# Patient Record
Sex: Female | Born: 1995 | Race: White | Hispanic: No | Marital: Single | Smoking: Never smoker
Health system: Southern US, Community
[De-identification: ages and names within clinical notes are randomized; demographics above are authoritative.]

## PROBLEM LIST (undated history)

## (undated) DIAGNOSIS — E282 Polycystic ovarian syndrome: Secondary | ICD-10-CM

## (undated) HISTORY — PX: OTHER SURGICAL HISTORY: SHX169

## (undated) HISTORY — PX: BREAST SURGERY: SHX581

---
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
## 2013-01-18 DIAGNOSIS — R21 Rash and other nonspecific skin eruption: Secondary | ICD-10-CM | POA: Insufficient documentation

## 2013-01-18 NOTE — ED Notes (Signed)
Pt. reports generalized non itchy rash onset last night , respirations unlabored / airway intact .

## 2013-01-19 ENCOUNTER — Emergency Department (HOSPITAL_COMMUNITY)
Admission: EM | Admit: 2013-01-19 | Discharge: 2013-01-19 | Disposition: A | Discharge status: Home / Self Care | Payer: BC Managed Care – PPO | Attending: Emergency Medicine | Admitting: Emergency Medicine

## 2013-01-19 ENCOUNTER — Encounter (HOSPITAL_COMMUNITY): Payer: Self-pay | Admitting: Emergency Medicine

## 2013-01-19 ENCOUNTER — Emergency Department (HOSPITAL_COMMUNITY)
Admission: EM | Admit: 2013-01-19 | Discharge: 2013-01-19 | Discharge status: Against Medical Advice | Payer: BC Managed Care – PPO | Attending: Emergency Medicine | Admitting: Emergency Medicine

## 2013-01-19 ENCOUNTER — Emergency Department (HOSPITAL_COMMUNITY): Payer: BC Managed Care – PPO

## 2013-01-19 DIAGNOSIS — R21 Rash and other nonspecific skin eruption: Secondary | ICD-10-CM | POA: Insufficient documentation

## 2013-01-19 DIAGNOSIS — J45909 Unspecified asthma, uncomplicated: Secondary | ICD-10-CM | POA: Insufficient documentation

## 2013-01-19 DIAGNOSIS — Z79899 Other long term (current) drug therapy: Secondary | ICD-10-CM | POA: Insufficient documentation

## 2013-01-19 DIAGNOSIS — Z8639 Personal history of other endocrine, nutritional and metabolic disease: Secondary | ICD-10-CM | POA: Insufficient documentation

## 2013-01-19 DIAGNOSIS — Z862 Personal history of diseases of the blood and blood-forming organs and certain disorders involving the immune mechanism: Secondary | ICD-10-CM | POA: Insufficient documentation

## 2013-01-19 DIAGNOSIS — L299 Pruritus, unspecified: Secondary | ICD-10-CM | POA: Insufficient documentation

## 2013-01-19 DIAGNOSIS — T7840XA Allergy, unspecified, initial encounter: Secondary | ICD-10-CM

## 2013-01-19 HISTORY — DX: Polycystic ovarian syndrome: E28.2

## 2013-01-19 MED ORDER — FAMOTIDINE 20 MG PO TABS: 20.0000 mg | ORAL_TABLET | Freq: Two times a day (BID) | ORAL | Status: AC

## 2013-01-19 MED ORDER — DIPHENHYDRAMINE HCL 25 MG PO TABS: 25.0000 mg | ORAL_TABLET | Freq: Four times a day (QID) | ORAL | Status: AC

## 2013-01-19 MED ORDER — PREDNISONE 10 MG PO TABS: 60.0000 mg | ORAL_TABLET | Freq: Every day | ORAL | Status: AC

## 2013-01-19 NOTE — ED Provider Notes (Signed)
CSN: [id number]     Arrival date & time 01/19/13  0036 History   First MD Initiated Contact with Patient 01/19/13 0231     chief complaint: rash,itch  HPI Patient reports developing itching rash over the past 12-24 hours.  She has no significant history of allergies or allergic reactions before in the past.  History of asthma or eczema.  She has not tried any Benadryl for her symptoms.  She reports this is bilateral arm itching as well as rash involving the majority of all of her body.  The only thing she can think of is that she had macadamia nuts on Sunday night and the only other time she had symptoms like this was a month ago at which time she was also having macadamia nuts.  At that time a month ago she seen a dermatologist who gave her doxycycline for what sounds like possible [facility] spotted fever and a "steroid cream".  The patient reports improvement in her symptoms to 3 days later.  Her symptoms at this time are mild.  She does feel a fullness in her chest but is not significantly short of breath.  She has no difficulty breathing and no throat swelling   Past Medical History  Diagnosis Date  . PCOS (polycystic ovarian syndrome)    Past Surgical History  Procedure Laterality Date  . Breast surgery    . Labial surgery     No family history on file. History  Substance Use Topics  . Smoking status: Never Smoker   . Smokeless tobacco: Not on file  . Alcohol Use: No   OB History   Grav Para Term Preterm Abortions TAB SAB Ect Mult Living                 Review of Systems  All other systems reviewed and are negative.    Allergies  Review of patient's allergies indicates no known allergies.  Home Medications   Current Outpatient Rx  Name  Route  Sig  Dispense  Refill  . ibuprofen (ADVIL,MOTRIN) 200 MG tablet   Oral   Take 200 mg by mouth every 6 (six) hours as needed for pain (pain).         . Multiple Vitamins-Minerals (MULTIVITAMIN WITH MINERALS) tablet  Oral   Take 1 tablet by mouth daily.         . Norethindrone Acetate-Ethinyl Estradiol (JUNEL 1.5/30) 1.5-30 MG-MCG tablet   Oral   Take 1 tablet by mouth daily.         . diphenhydrAMINE (BENADRYL) 25 MG tablet   Oral   Take 1 tablet (25 mg total) by mouth every 6 (six) hours.   20 tablet   0   . famotidine (PEPCID) 20 MG tablet   Oral   Take 1 tablet (20 mg total) by mouth 2 (two) times daily.   10 tablet   0   . predniSONE (DELTASONE) 10 MG tablet   Oral   Take 6 tablets (60 mg total) by mouth daily.   30 tablet   0    LMP 01/14/2013 Physical Exam  Nursing note and vitals reviewed. Constitutional: She is oriented to person, place, and time. She appears well-developed and well-nourished. No distress.  HENT:  Head: Normocephalic and atraumatic.  Eyes: EOM are normal.  Neck: Normal range of motion.  Cardiovascular: Normal rate, regular rhythm and normal heart sounds.   Pulmonary/Chest: Effort normal and breath sounds normal.  Abdominal: Soft. She exhibits no distension.  There is no tenderness.  Musculoskeletal: Normal range of motion.  Neurological: She is alert and oriented to person, place, and time.  Skin: Skin is warm and dry.  Psychiatric: She has a normal mood and affect. Judgment normal.    ED Course  Procedures (including critical care time) Labs Review Labs Reviewed - No data to display Imaging Review No results found.  MDM   1. Allergic reaction, initial encounter    3:23 AM Patient feels much better this time.  Chest x-ray is normal.  She's had improvement in her rash and Benadryl and steroids.  Home with Benadryl, steroids, Pepcid.  PCP followup.  She understands to return to the ER for new or worsening symptoms    Lyanne Co, MD 01/19/13 [phone]

## 2013-01-19 NOTE — ED Notes (Signed)
Pt st's she is going to [employer]

## 2013-01-19 NOTE — ED Notes (Signed)
See downtime RN paperwork for RN notes and triage.

## 2013-12-05 ENCOUNTER — Other Ambulatory Visit: Payer: Self-pay | Admitting: Internal Medicine

## 2013-12-05 DIAGNOSIS — R109 Unspecified abdominal pain: Secondary | ICD-10-CM

## 2013-12-09 ENCOUNTER — Ambulatory Visit
Admission: RE | Admit: 2013-12-09 | Discharge: 2013-12-09 | Disposition: A | Discharge status: Home / Self Care | Payer: BC Managed Care – PPO | Source: Ambulatory Visit | Attending: Internal Medicine | Admitting: Internal Medicine

## 2013-12-09 DIAGNOSIS — R109 Unspecified abdominal pain: Secondary | ICD-10-CM

## 2015-03-19 IMAGING — CR DG CHEST 2V
2 series · 2 of 2 positions shown · non-contrast
Comparison: None

CLINICAL DATA: Shortness of breath; chest pain.

CHEST - 2 VIEW

[w chest pa]
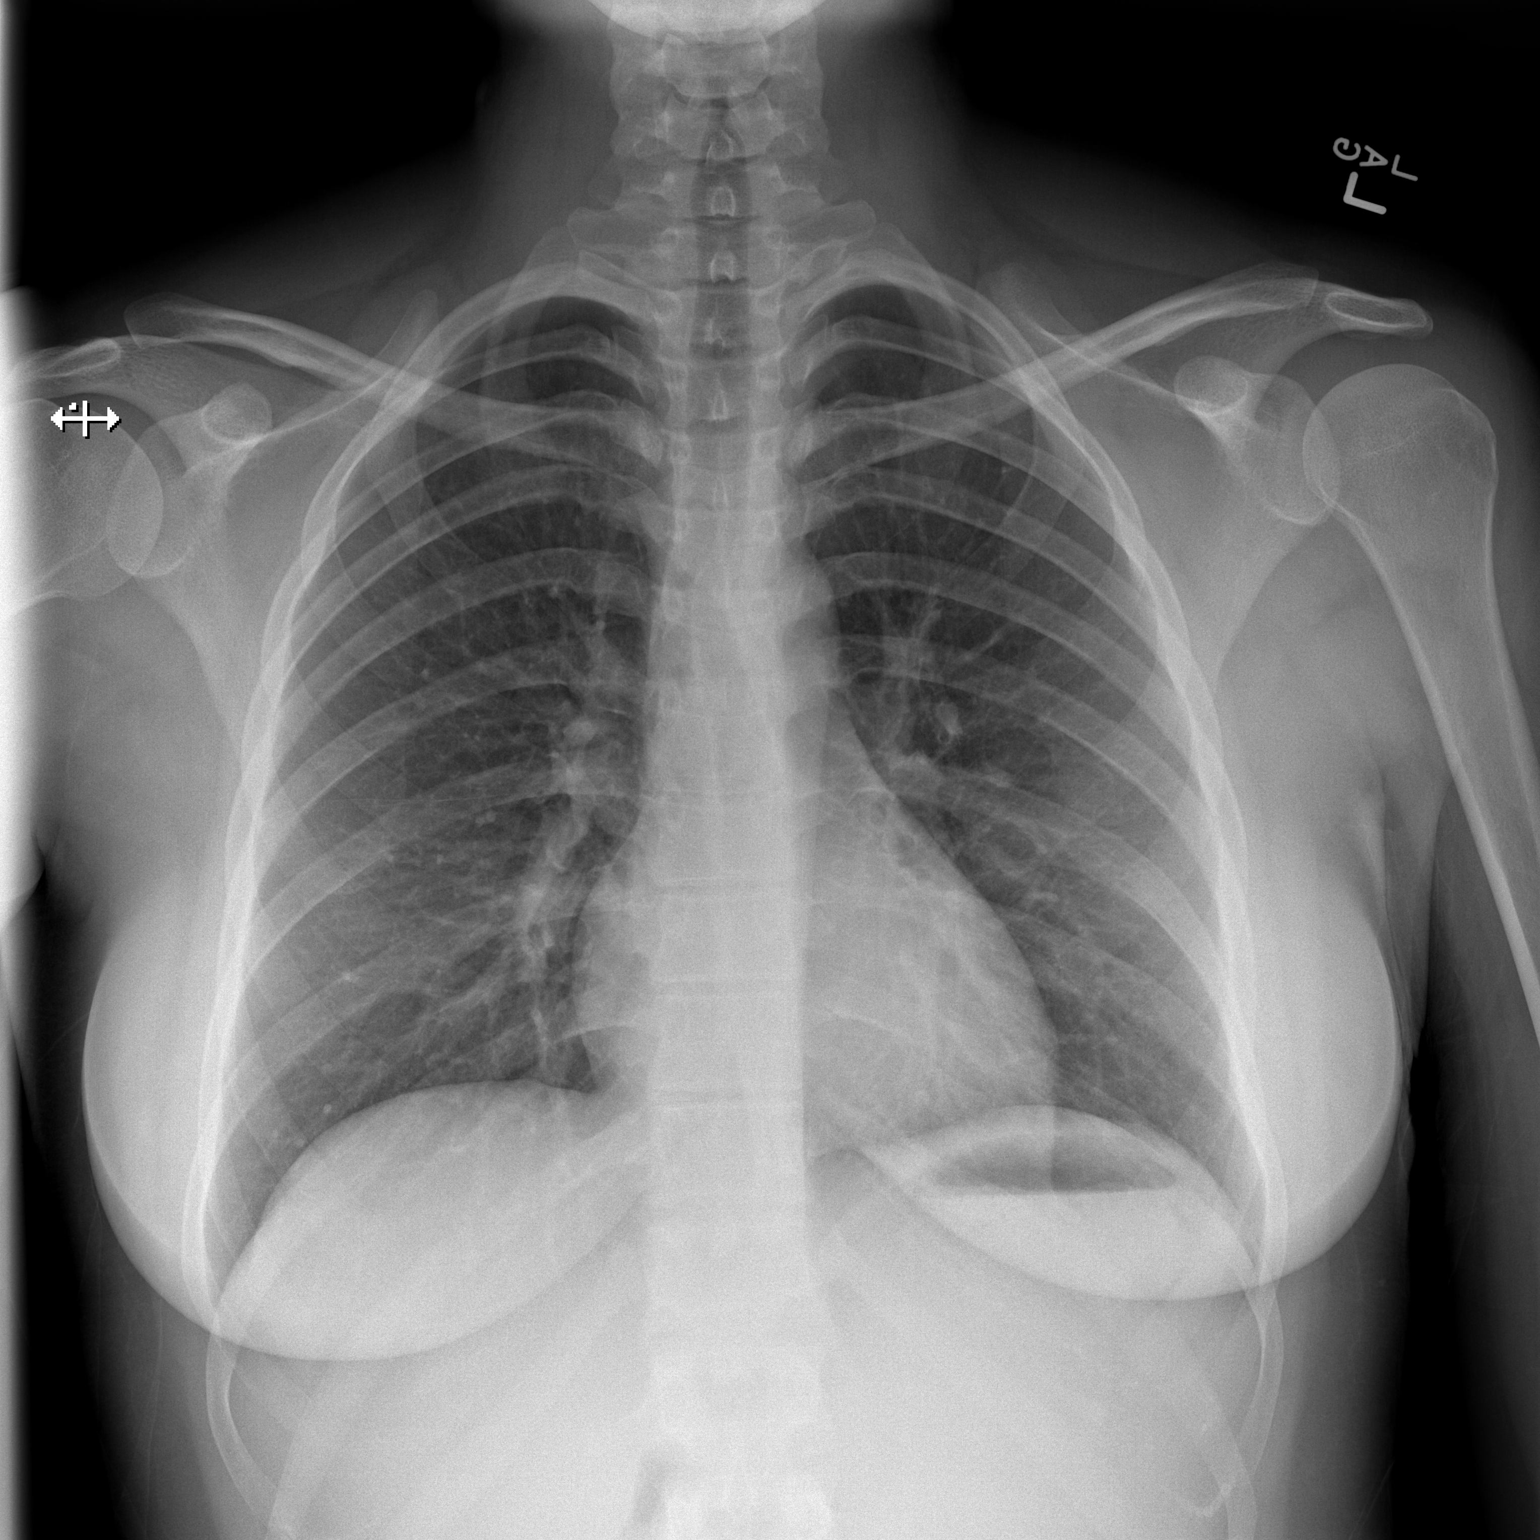

[w chest lat]
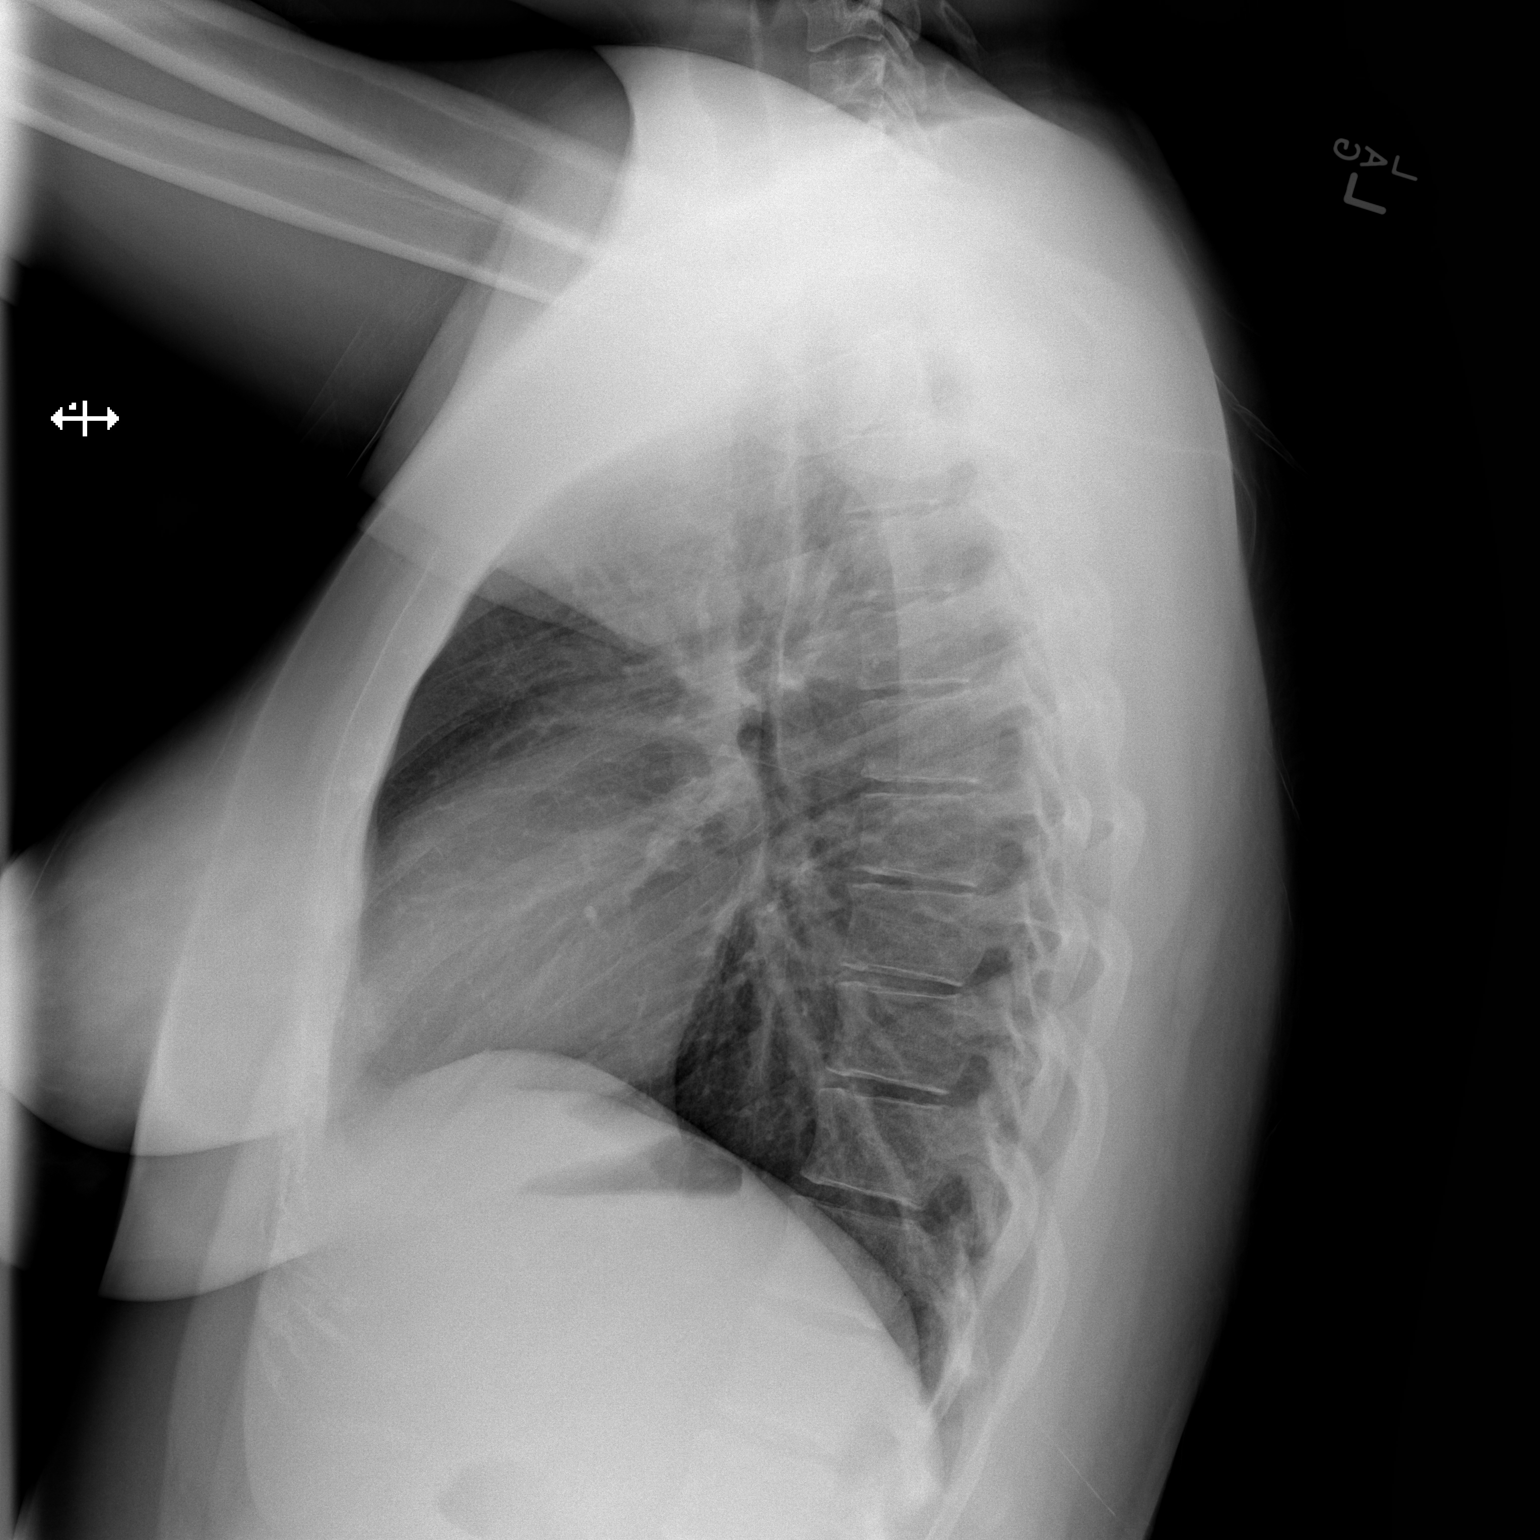

[2 of 2 positions shown; findings below may reference images not displayed]

FINDINGS: The lungs are well-aerated and clear.  There is no
evidence of focal opacification, pleural effusion or pneumothorax.

The heart size and mediastinal contours are within normal limits.
The visualized skeletal structures are unremarkable.
IMPRESSION: No active cardiopulmonary disease.

## 2016-02-06 IMAGING — US US ABDOMEN COMPLETE
1 series · 14 of 25 positions shown · non-contrast
Comparison: None.

CLINICAL DATA: Abdominal pain

EXAM:
ULTRASOUND ABDOMEN COMPLETE

[Series 1: us abdomen complete · 0.28mm/px · 14 of 65 slices shown]
[im 1/65]
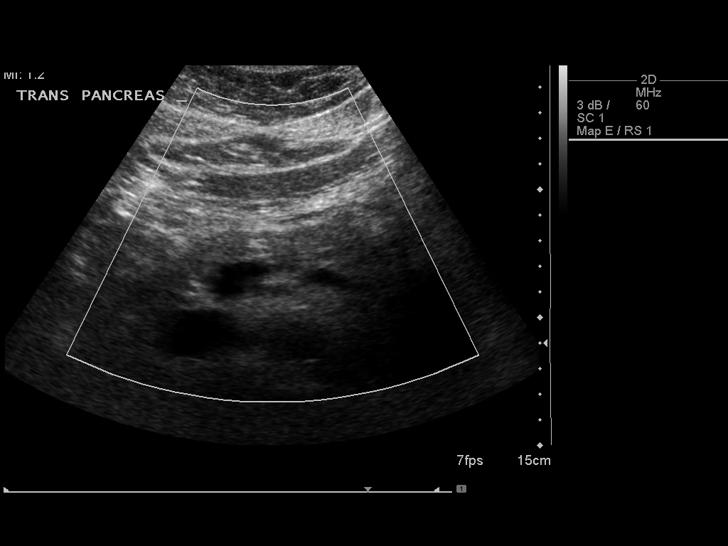
[im 6/65]
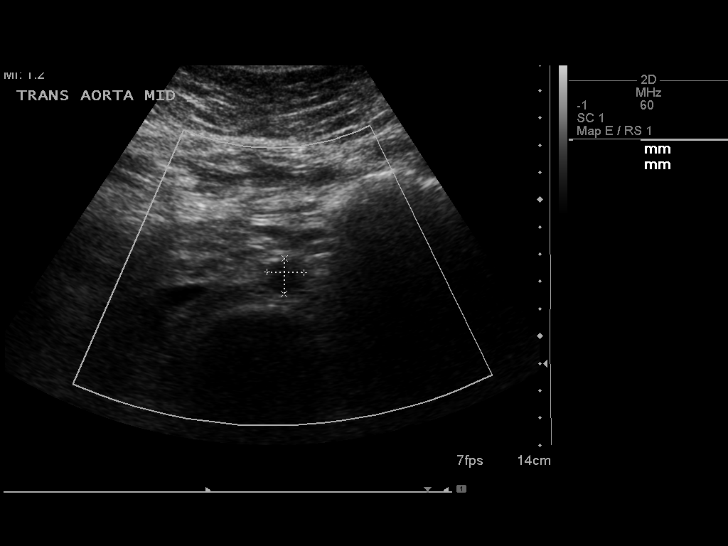
[im 11/65]
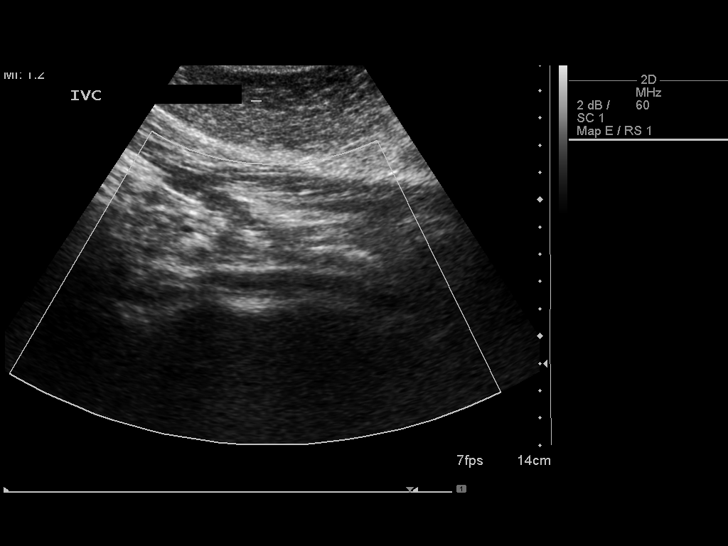
[im 17/65]
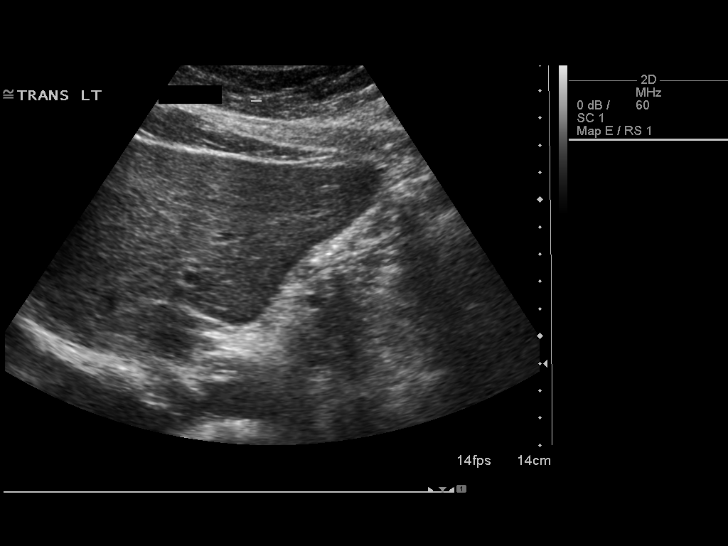
[im 22/65]
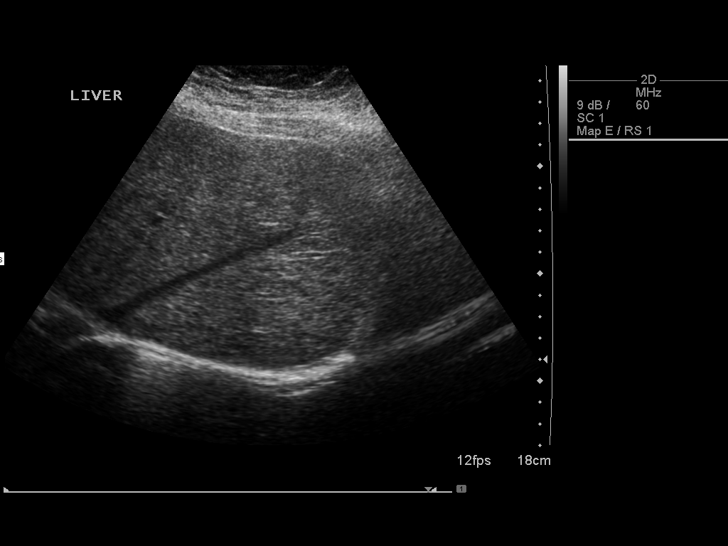
[im 25/65]
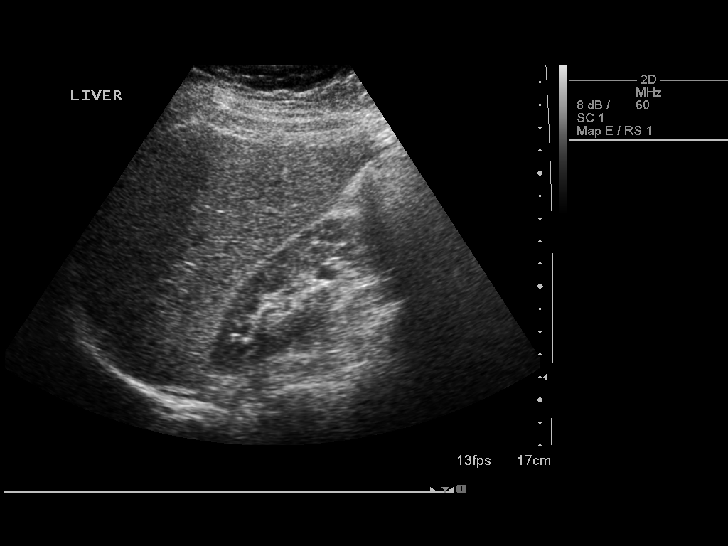
[im 30/65]
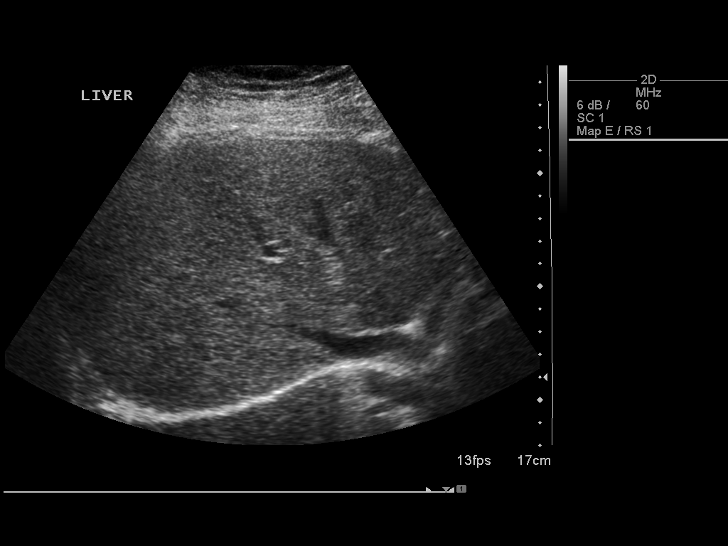
[im 35/65]
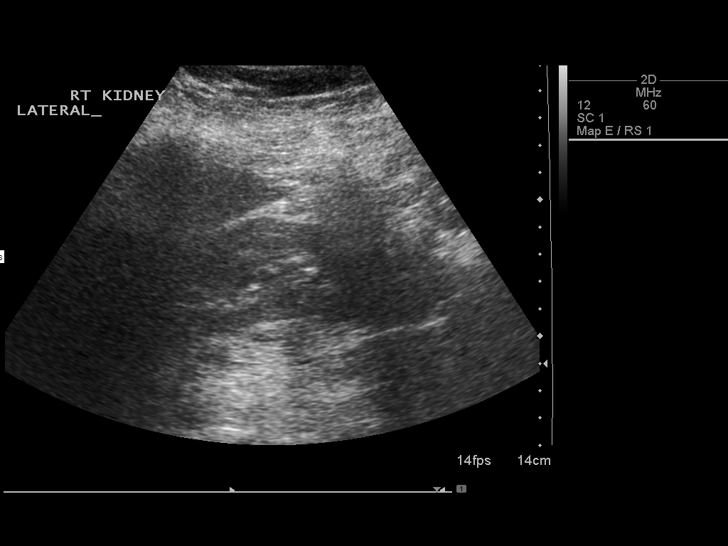
[im 41/65]
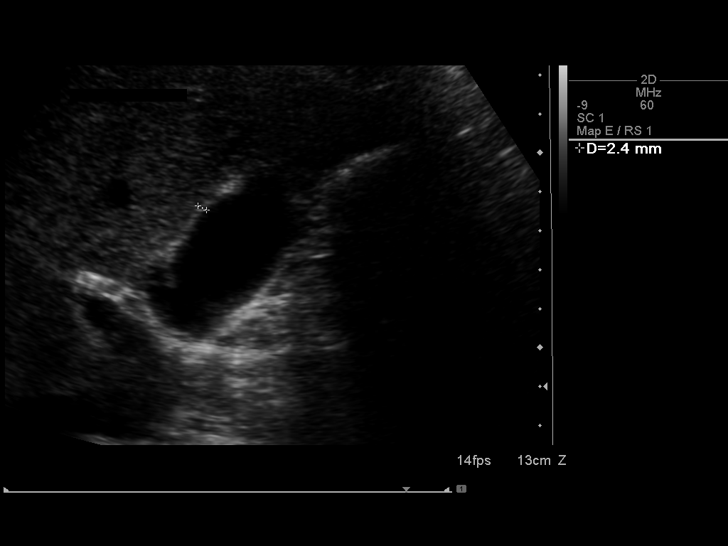
[im 43/65]
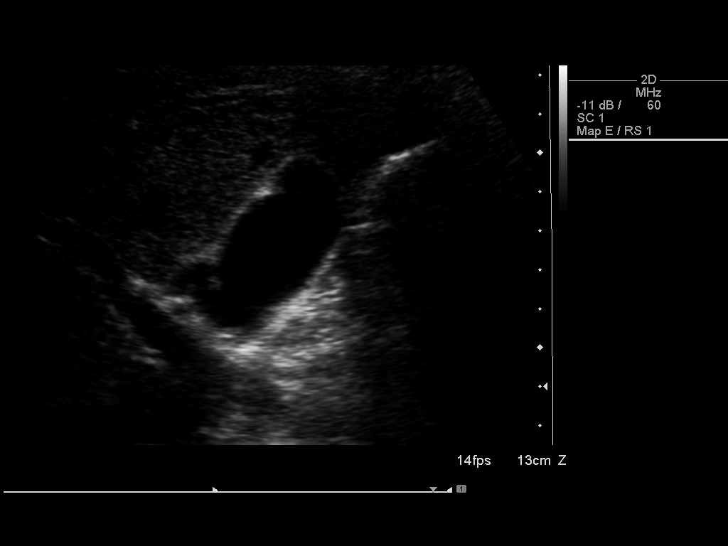
[im 49/65]
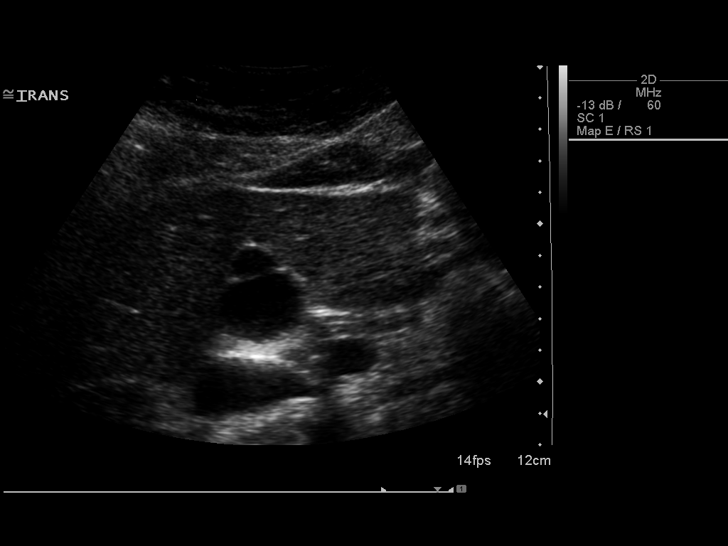
[im 54/65]
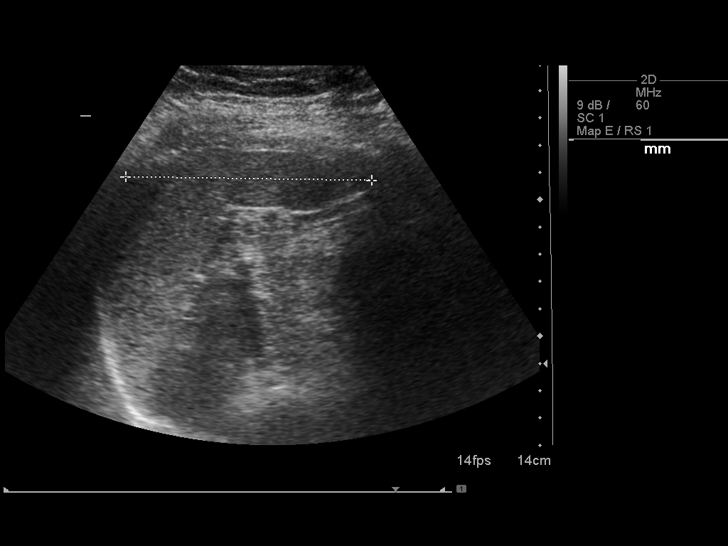
[im 59/65]
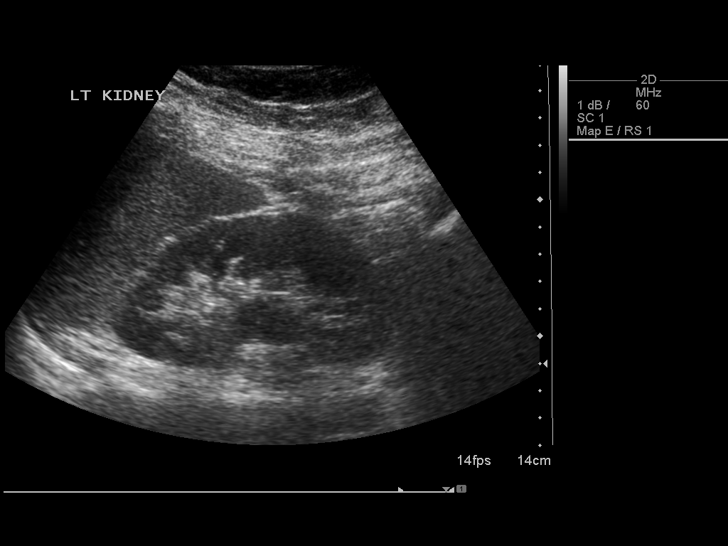
[im 65/65]
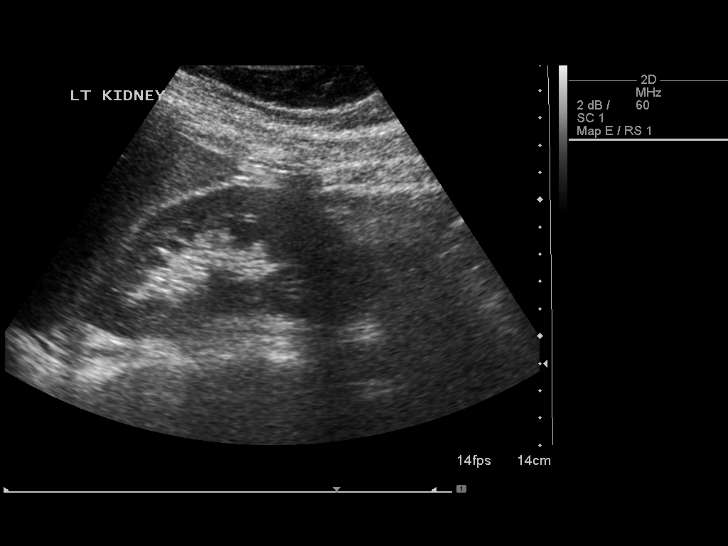

[14 of 25 positions shown; findings below may reference images not displayed]

FINDINGS: Gallbladder:

No gallstones or wall thickening visualized. There is no
pericholecystic fluid. No sonographic Murphy sign noted.

Common bile duct:

Diameter: 1 mm. There is no intrahepatic, common hepatic, or common
bile duct dilatation.

Liver:

No focal lesion identified. Within normal limits in parenchymal
echogenicity.

IVC:

No abnormality visualized.

Pancreas:

No mass or inflammatory focus.

Spleen:

Size and appearance within normal limits.

Right Kidney:

Length: 11.0 cm. Echogenicity within normal limits. No mass or
hydronephrosis visualized.

Left Kidney:

Length: 10.7 cm. Echogenicity within normal limits. No mass or
hydronephrosis visualized.

Abdominal aorta:

No aneurysm visualized.

Other findings:

No demonstrable ascites.
IMPRESSION: Study within normal limits.
